# Patient Record
Sex: Female | Born: 2000 | Hispanic: No | Marital: Single | Smoking: Never smoker
Health system: Southern US, Community
[De-identification: ages and names within clinical notes are randomized; demographics above are authoritative.]

## PROBLEM LIST (undated history)

## (undated) DIAGNOSIS — N289 Disorder of kidney and ureter, unspecified: Secondary | ICD-10-CM

---
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
## 2018-01-14 DIAGNOSIS — F339 Major depressive disorder, recurrent, unspecified: Secondary | ICD-10-CM | POA: Diagnosis not present

## 2018-02-02 DIAGNOSIS — F339 Major depressive disorder, recurrent, unspecified: Secondary | ICD-10-CM | POA: Diagnosis not present

## 2018-03-10 DIAGNOSIS — F900 Attention-deficit hyperactivity disorder, predominantly inattentive type: Secondary | ICD-10-CM | POA: Diagnosis not present

## 2018-03-10 DIAGNOSIS — F332 Major depressive disorder, recurrent severe without psychotic features: Secondary | ICD-10-CM | POA: Diagnosis not present

## 2018-03-11 DIAGNOSIS — F332 Major depressive disorder, recurrent severe without psychotic features: Secondary | ICD-10-CM | POA: Diagnosis not present

## 2018-03-11 DIAGNOSIS — F900 Attention-deficit hyperactivity disorder, predominantly inattentive type: Secondary | ICD-10-CM | POA: Diagnosis not present

## 2018-03-12 DIAGNOSIS — F339 Major depressive disorder, recurrent, unspecified: Secondary | ICD-10-CM | POA: Diagnosis not present

## 2018-03-13 DIAGNOSIS — F900 Attention-deficit hyperactivity disorder, predominantly inattentive type: Secondary | ICD-10-CM | POA: Diagnosis not present

## 2018-03-13 DIAGNOSIS — F332 Major depressive disorder, recurrent severe without psychotic features: Secondary | ICD-10-CM | POA: Diagnosis not present

## 2018-04-02 DIAGNOSIS — F900 Attention-deficit hyperactivity disorder, predominantly inattentive type: Secondary | ICD-10-CM | POA: Diagnosis not present

## 2018-04-02 DIAGNOSIS — F332 Major depressive disorder, recurrent severe without psychotic features: Secondary | ICD-10-CM | POA: Diagnosis not present

## 2018-04-08 DIAGNOSIS — F900 Attention-deficit hyperactivity disorder, predominantly inattentive type: Secondary | ICD-10-CM | POA: Diagnosis not present

## 2018-04-08 DIAGNOSIS — F332 Major depressive disorder, recurrent severe without psychotic features: Secondary | ICD-10-CM | POA: Diagnosis not present

## 2018-04-09 DIAGNOSIS — F332 Major depressive disorder, recurrent severe without psychotic features: Secondary | ICD-10-CM | POA: Diagnosis not present

## 2018-04-09 DIAGNOSIS — F900 Attention-deficit hyperactivity disorder, predominantly inattentive type: Secondary | ICD-10-CM | POA: Diagnosis not present

## 2018-04-21 DIAGNOSIS — F3340 Major depressive disorder, recurrent, in remission, unspecified: Secondary | ICD-10-CM | POA: Diagnosis not present

## 2018-06-16 DIAGNOSIS — S0101XA Laceration without foreign body of scalp, initial encounter: Secondary | ICD-10-CM | POA: Diagnosis not present

## 2018-07-29 DIAGNOSIS — F900 Attention-deficit hyperactivity disorder, predominantly inattentive type: Secondary | ICD-10-CM | POA: Diagnosis not present

## 2018-07-29 DIAGNOSIS — F3340 Major depressive disorder, recurrent, in remission, unspecified: Secondary | ICD-10-CM | POA: Diagnosis not present

## 2018-08-04 DIAGNOSIS — F4310 Post-traumatic stress disorder, unspecified: Secondary | ICD-10-CM | POA: Diagnosis not present

## 2018-09-15 DIAGNOSIS — Z1159 Encounter for screening for other viral diseases: Secondary | ICD-10-CM | POA: Diagnosis not present

## 2019-01-26 DIAGNOSIS — Z1159 Encounter for screening for other viral diseases: Secondary | ICD-10-CM | POA: Diagnosis not present

## 2019-02-09 DIAGNOSIS — F900 Attention-deficit hyperactivity disorder, predominantly inattentive type: Secondary | ICD-10-CM | POA: Diagnosis not present

## 2019-02-09 DIAGNOSIS — F3340 Major depressive disorder, recurrent, in remission, unspecified: Secondary | ICD-10-CM | POA: Diagnosis not present

## 2019-02-16 DIAGNOSIS — F4310 Post-traumatic stress disorder, unspecified: Secondary | ICD-10-CM | POA: Diagnosis not present

## 2019-02-19 DIAGNOSIS — Z20828 Contact with and (suspected) exposure to other viral communicable diseases: Secondary | ICD-10-CM | POA: Diagnosis not present

## 2019-02-22 DIAGNOSIS — F4310 Post-traumatic stress disorder, unspecified: Secondary | ICD-10-CM | POA: Diagnosis not present

## 2019-03-01 DIAGNOSIS — F4310 Post-traumatic stress disorder, unspecified: Secondary | ICD-10-CM | POA: Diagnosis not present

## 2019-03-09 DIAGNOSIS — F3340 Major depressive disorder, recurrent, in remission, unspecified: Secondary | ICD-10-CM | POA: Diagnosis not present

## 2019-03-09 DIAGNOSIS — F900 Attention-deficit hyperactivity disorder, predominantly inattentive type: Secondary | ICD-10-CM | POA: Diagnosis not present

## 2019-03-10 DIAGNOSIS — F4310 Post-traumatic stress disorder, unspecified: Secondary | ICD-10-CM | POA: Diagnosis not present

## 2019-03-16 DIAGNOSIS — F4310 Post-traumatic stress disorder, unspecified: Secondary | ICD-10-CM | POA: Diagnosis not present

## 2019-03-23 DIAGNOSIS — F4310 Post-traumatic stress disorder, unspecified: Secondary | ICD-10-CM | POA: Diagnosis not present

## 2019-03-29 DIAGNOSIS — F900 Attention-deficit hyperactivity disorder, predominantly inattentive type: Secondary | ICD-10-CM | POA: Diagnosis not present

## 2019-03-29 DIAGNOSIS — F3340 Major depressive disorder, recurrent, in remission, unspecified: Secondary | ICD-10-CM | POA: Diagnosis not present

## 2019-04-06 DIAGNOSIS — F4310 Post-traumatic stress disorder, unspecified: Secondary | ICD-10-CM | POA: Diagnosis not present

## 2019-04-14 DIAGNOSIS — F4310 Post-traumatic stress disorder, unspecified: Secondary | ICD-10-CM | POA: Diagnosis not present

## 2019-04-20 DIAGNOSIS — F4310 Post-traumatic stress disorder, unspecified: Secondary | ICD-10-CM | POA: Diagnosis not present

## 2019-04-26 DIAGNOSIS — F3340 Major depressive disorder, recurrent, in remission, unspecified: Secondary | ICD-10-CM | POA: Diagnosis not present

## 2019-04-26 DIAGNOSIS — F900 Attention-deficit hyperactivity disorder, predominantly inattentive type: Secondary | ICD-10-CM | POA: Diagnosis not present

## 2019-04-27 DIAGNOSIS — F4310 Post-traumatic stress disorder, unspecified: Secondary | ICD-10-CM | POA: Diagnosis not present

## 2019-05-04 DIAGNOSIS — F4310 Post-traumatic stress disorder, unspecified: Secondary | ICD-10-CM | POA: Diagnosis not present

## 2019-05-05 DIAGNOSIS — Z0000 Encounter for general adult medical examination without abnormal findings: Secondary | ICD-10-CM | POA: Diagnosis not present

## 2019-05-11 DIAGNOSIS — F4310 Post-traumatic stress disorder, unspecified: Secondary | ICD-10-CM | POA: Diagnosis not present

## 2019-05-18 DIAGNOSIS — F4310 Post-traumatic stress disorder, unspecified: Secondary | ICD-10-CM | POA: Diagnosis not present

## 2019-05-24 DIAGNOSIS — F339 Major depressive disorder, recurrent, unspecified: Secondary | ICD-10-CM | POA: Diagnosis not present

## 2019-05-25 DIAGNOSIS — F4310 Post-traumatic stress disorder, unspecified: Secondary | ICD-10-CM | POA: Diagnosis not present

## 2019-06-01 DIAGNOSIS — F4310 Post-traumatic stress disorder, unspecified: Secondary | ICD-10-CM | POA: Diagnosis not present

## 2019-06-08 DIAGNOSIS — F4310 Post-traumatic stress disorder, unspecified: Secondary | ICD-10-CM | POA: Diagnosis not present

## 2019-06-15 DIAGNOSIS — F4310 Post-traumatic stress disorder, unspecified: Secondary | ICD-10-CM | POA: Diagnosis not present

## 2019-06-22 DIAGNOSIS — F4310 Post-traumatic stress disorder, unspecified: Secondary | ICD-10-CM | POA: Diagnosis not present

## 2019-07-06 DIAGNOSIS — F4310 Post-traumatic stress disorder, unspecified: Secondary | ICD-10-CM | POA: Diagnosis not present

## 2019-07-13 DIAGNOSIS — F4310 Post-traumatic stress disorder, unspecified: Secondary | ICD-10-CM | POA: Diagnosis not present

## 2019-07-20 DIAGNOSIS — F4310 Post-traumatic stress disorder, unspecified: Secondary | ICD-10-CM | POA: Diagnosis not present

## 2019-07-25 DIAGNOSIS — J0110 Acute frontal sinusitis, unspecified: Secondary | ICD-10-CM | POA: Diagnosis not present

## 2019-07-27 DIAGNOSIS — F4310 Post-traumatic stress disorder, unspecified: Secondary | ICD-10-CM | POA: Diagnosis not present

## 2019-08-10 DIAGNOSIS — F339 Major depressive disorder, recurrent, unspecified: Secondary | ICD-10-CM | POA: Diagnosis not present

## 2019-08-20 DIAGNOSIS — F4310 Post-traumatic stress disorder, unspecified: Secondary | ICD-10-CM | POA: Diagnosis not present

## 2019-08-30 DIAGNOSIS — F4310 Post-traumatic stress disorder, unspecified: Secondary | ICD-10-CM | POA: Diagnosis not present

## 2019-09-15 DIAGNOSIS — F4310 Post-traumatic stress disorder, unspecified: Secondary | ICD-10-CM | POA: Diagnosis not present

## 2019-09-20 DIAGNOSIS — F339 Major depressive disorder, recurrent, unspecified: Secondary | ICD-10-CM | POA: Diagnosis not present

## 2019-10-27 DIAGNOSIS — F4310 Post-traumatic stress disorder, unspecified: Secondary | ICD-10-CM | POA: Diagnosis not present

## 2019-11-01 DIAGNOSIS — F4310 Post-traumatic stress disorder, unspecified: Secondary | ICD-10-CM | POA: Diagnosis not present

## 2019-11-24 DIAGNOSIS — F4310 Post-traumatic stress disorder, unspecified: Secondary | ICD-10-CM | POA: Diagnosis not present

## 2019-12-01 DIAGNOSIS — F4310 Post-traumatic stress disorder, unspecified: Secondary | ICD-10-CM | POA: Diagnosis not present

## 2019-12-08 DIAGNOSIS — F4310 Post-traumatic stress disorder, unspecified: Secondary | ICD-10-CM | POA: Diagnosis not present

## 2019-12-14 DIAGNOSIS — F4310 Post-traumatic stress disorder, unspecified: Secondary | ICD-10-CM | POA: Diagnosis not present

## 2019-12-22 DIAGNOSIS — F4310 Post-traumatic stress disorder, unspecified: Secondary | ICD-10-CM | POA: Diagnosis not present

## 2019-12-29 DIAGNOSIS — F4310 Post-traumatic stress disorder, unspecified: Secondary | ICD-10-CM | POA: Diagnosis not present

## 2020-01-05 DIAGNOSIS — F4310 Post-traumatic stress disorder, unspecified: Secondary | ICD-10-CM | POA: Diagnosis not present

## 2020-01-12 DIAGNOSIS — F4310 Post-traumatic stress disorder, unspecified: Secondary | ICD-10-CM | POA: Diagnosis not present

## 2020-01-26 DIAGNOSIS — F4310 Post-traumatic stress disorder, unspecified: Secondary | ICD-10-CM | POA: Diagnosis not present

## 2020-01-31 DIAGNOSIS — J00 Acute nasopharyngitis [common cold]: Secondary | ICD-10-CM | POA: Diagnosis not present

## 2020-01-31 DIAGNOSIS — J09X9 Influenza due to identified novel influenza A virus with other manifestations: Secondary | ICD-10-CM | POA: Diagnosis not present

## 2020-02-02 DIAGNOSIS — F4310 Post-traumatic stress disorder, unspecified: Secondary | ICD-10-CM | POA: Diagnosis not present

## 2020-02-09 DIAGNOSIS — F4310 Post-traumatic stress disorder, unspecified: Secondary | ICD-10-CM | POA: Diagnosis not present

## 2020-02-16 DIAGNOSIS — F4310 Post-traumatic stress disorder, unspecified: Secondary | ICD-10-CM | POA: Diagnosis not present

## 2020-03-07 DIAGNOSIS — F339 Major depressive disorder, recurrent, unspecified: Secondary | ICD-10-CM | POA: Diagnosis not present

## 2020-03-08 DIAGNOSIS — F4310 Post-traumatic stress disorder, unspecified: Secondary | ICD-10-CM | POA: Diagnosis not present

## 2020-03-15 DIAGNOSIS — F4310 Post-traumatic stress disorder, unspecified: Secondary | ICD-10-CM | POA: Diagnosis not present

## 2020-03-22 DIAGNOSIS — F4310 Post-traumatic stress disorder, unspecified: Secondary | ICD-10-CM | POA: Diagnosis not present

## 2020-03-29 DIAGNOSIS — F4310 Post-traumatic stress disorder, unspecified: Secondary | ICD-10-CM | POA: Diagnosis not present

## 2020-04-05 DIAGNOSIS — F4310 Post-traumatic stress disorder, unspecified: Secondary | ICD-10-CM | POA: Diagnosis not present

## 2020-04-12 DIAGNOSIS — F4310 Post-traumatic stress disorder, unspecified: Secondary | ICD-10-CM | POA: Diagnosis not present

## 2020-04-19 DIAGNOSIS — F4310 Post-traumatic stress disorder, unspecified: Secondary | ICD-10-CM | POA: Diagnosis not present

## 2020-04-26 DIAGNOSIS — F4310 Post-traumatic stress disorder, unspecified: Secondary | ICD-10-CM | POA: Diagnosis not present

## 2020-05-03 DIAGNOSIS — F4310 Post-traumatic stress disorder, unspecified: Secondary | ICD-10-CM | POA: Diagnosis not present

## 2020-05-08 DIAGNOSIS — R110 Nausea: Secondary | ICD-10-CM | POA: Diagnosis not present

## 2020-05-08 DIAGNOSIS — F411 Generalized anxiety disorder: Secondary | ICD-10-CM | POA: Diagnosis not present

## 2020-05-10 DIAGNOSIS — F4310 Post-traumatic stress disorder, unspecified: Secondary | ICD-10-CM | POA: Diagnosis not present

## 2020-05-11 DIAGNOSIS — F339 Major depressive disorder, recurrent, unspecified: Secondary | ICD-10-CM | POA: Diagnosis not present

## 2020-05-24 DIAGNOSIS — N3001 Acute cystitis with hematuria: Secondary | ICD-10-CM | POA: Diagnosis not present

## 2020-05-24 DIAGNOSIS — N760 Acute vaginitis: Secondary | ICD-10-CM | POA: Diagnosis not present

## 2020-05-31 DIAGNOSIS — F4310 Post-traumatic stress disorder, unspecified: Secondary | ICD-10-CM | POA: Diagnosis not present

## 2020-06-01 DIAGNOSIS — F339 Major depressive disorder, recurrent, unspecified: Secondary | ICD-10-CM | POA: Diagnosis not present

## 2020-06-07 DIAGNOSIS — F4310 Post-traumatic stress disorder, unspecified: Secondary | ICD-10-CM | POA: Diagnosis not present

## 2020-06-12 DIAGNOSIS — F339 Major depressive disorder, recurrent, unspecified: Secondary | ICD-10-CM | POA: Diagnosis not present

## 2020-06-14 DIAGNOSIS — F4310 Post-traumatic stress disorder, unspecified: Secondary | ICD-10-CM | POA: Diagnosis not present

## 2020-06-28 DIAGNOSIS — F4310 Post-traumatic stress disorder, unspecified: Secondary | ICD-10-CM | POA: Diagnosis not present

## 2020-07-05 DIAGNOSIS — F4310 Post-traumatic stress disorder, unspecified: Secondary | ICD-10-CM | POA: Diagnosis not present

## 2020-07-12 DIAGNOSIS — F4310 Post-traumatic stress disorder, unspecified: Secondary | ICD-10-CM | POA: Diagnosis not present

## 2020-07-17 DIAGNOSIS — F4310 Post-traumatic stress disorder, unspecified: Secondary | ICD-10-CM | POA: Diagnosis not present

## 2020-07-18 DIAGNOSIS — Z131 Encounter for screening for diabetes mellitus: Secondary | ICD-10-CM | POA: Diagnosis not present

## 2020-07-18 DIAGNOSIS — Z13220 Encounter for screening for lipoid disorders: Secondary | ICD-10-CM | POA: Diagnosis not present

## 2020-07-18 DIAGNOSIS — Z0000 Encounter for general adult medical examination without abnormal findings: Secondary | ICD-10-CM | POA: Diagnosis not present

## 2020-07-19 DIAGNOSIS — F4310 Post-traumatic stress disorder, unspecified: Secondary | ICD-10-CM | POA: Diagnosis not present

## 2020-07-26 DIAGNOSIS — F4310 Post-traumatic stress disorder, unspecified: Secondary | ICD-10-CM | POA: Diagnosis not present

## 2020-08-02 DIAGNOSIS — F4310 Post-traumatic stress disorder, unspecified: Secondary | ICD-10-CM | POA: Diagnosis not present

## 2020-08-03 DIAGNOSIS — Z01419 Encounter for gynecological examination (general) (routine) without abnormal findings: Secondary | ICD-10-CM | POA: Diagnosis not present

## 2020-08-03 DIAGNOSIS — Z113 Encounter for screening for infections with a predominantly sexual mode of transmission: Secondary | ICD-10-CM | POA: Diagnosis not present

## 2020-08-03 DIAGNOSIS — N898 Other specified noninflammatory disorders of vagina: Secondary | ICD-10-CM | POA: Diagnosis not present

## 2020-08-09 DIAGNOSIS — F4310 Post-traumatic stress disorder, unspecified: Secondary | ICD-10-CM | POA: Diagnosis not present

## 2020-08-22 DIAGNOSIS — F4310 Post-traumatic stress disorder, unspecified: Secondary | ICD-10-CM | POA: Diagnosis not present

## 2020-08-29 DIAGNOSIS — F4310 Post-traumatic stress disorder, unspecified: Secondary | ICD-10-CM | POA: Diagnosis not present

## 2020-09-08 DIAGNOSIS — N3000 Acute cystitis without hematuria: Secondary | ICD-10-CM | POA: Diagnosis not present

## 2020-09-12 DIAGNOSIS — F4310 Post-traumatic stress disorder, unspecified: Secondary | ICD-10-CM | POA: Diagnosis not present

## 2020-09-26 DIAGNOSIS — F4310 Post-traumatic stress disorder, unspecified: Secondary | ICD-10-CM | POA: Diagnosis not present

## 2020-10-10 DIAGNOSIS — F4310 Post-traumatic stress disorder, unspecified: Secondary | ICD-10-CM | POA: Diagnosis not present

## 2020-11-07 DIAGNOSIS — F4310 Post-traumatic stress disorder, unspecified: Secondary | ICD-10-CM | POA: Diagnosis not present

## 2021-03-12 DIAGNOSIS — Z111 Encounter for screening for respiratory tuberculosis: Secondary | ICD-10-CM | POA: Diagnosis not present

## 2021-03-27 DIAGNOSIS — L700 Acne vulgaris: Secondary | ICD-10-CM | POA: Diagnosis not present

## 2021-03-28 DIAGNOSIS — F411 Generalized anxiety disorder: Secondary | ICD-10-CM | POA: Diagnosis not present

## 2021-04-12 DIAGNOSIS — B081 Molluscum contagiosum: Secondary | ICD-10-CM | POA: Diagnosis not present

## 2021-04-12 DIAGNOSIS — N941 Unspecified dyspareunia: Secondary | ICD-10-CM | POA: Diagnosis not present

## 2021-05-10 DIAGNOSIS — F411 Generalized anxiety disorder: Secondary | ICD-10-CM | POA: Diagnosis not present

## 2021-06-07 DIAGNOSIS — F411 Generalized anxiety disorder: Secondary | ICD-10-CM | POA: Diagnosis not present

## 2021-08-21 DIAGNOSIS — L700 Acne vulgaris: Secondary | ICD-10-CM | POA: Diagnosis not present

## 2021-08-21 DIAGNOSIS — Z79899 Other long term (current) drug therapy: Secondary | ICD-10-CM | POA: Diagnosis not present

## 2021-10-03 DIAGNOSIS — U071 COVID-19: Secondary | ICD-10-CM | POA: Diagnosis not present

## 2021-10-22 DIAGNOSIS — R300 Dysuria: Secondary | ICD-10-CM | POA: Diagnosis not present

## 2021-12-19 DIAGNOSIS — R300 Dysuria: Secondary | ICD-10-CM | POA: Diagnosis not present

## 2022-06-05 DIAGNOSIS — L700 Acne vulgaris: Secondary | ICD-10-CM | POA: Diagnosis not present

## 2022-06-05 DIAGNOSIS — L810 Postinflammatory hyperpigmentation: Secondary | ICD-10-CM | POA: Diagnosis not present

## 2022-06-20 DIAGNOSIS — Z0000 Encounter for general adult medical examination without abnormal findings: Secondary | ICD-10-CM | POA: Diagnosis not present

## 2022-08-15 DIAGNOSIS — R399 Unspecified symptoms and signs involving the genitourinary system: Secondary | ICD-10-CM | POA: Diagnosis not present

## 2022-08-15 DIAGNOSIS — R109 Unspecified abdominal pain: Secondary | ICD-10-CM | POA: Diagnosis not present

## 2022-10-25 DIAGNOSIS — R140 Abdominal distension (gaseous): Secondary | ICD-10-CM | POA: Diagnosis not present

## 2023-02-03 DIAGNOSIS — R140 Abdominal distension (gaseous): Secondary | ICD-10-CM | POA: Diagnosis not present

## 2023-02-05 ENCOUNTER — Encounter: Payer: Self-pay | Admitting: Student

## 2023-02-14 DIAGNOSIS — R140 Abdominal distension (gaseous): Secondary | ICD-10-CM | POA: Diagnosis not present

## 2023-02-14 DIAGNOSIS — R635 Abnormal weight gain: Secondary | ICD-10-CM | POA: Diagnosis not present

## 2023-02-14 DIAGNOSIS — R109 Unspecified abdominal pain: Secondary | ICD-10-CM | POA: Diagnosis not present

## 2023-02-14 DIAGNOSIS — R142 Eructation: Secondary | ICD-10-CM | POA: Diagnosis not present

## 2023-02-14 DIAGNOSIS — R1110 Vomiting, unspecified: Secondary | ICD-10-CM | POA: Diagnosis not present

## 2023-06-23 DIAGNOSIS — Z131 Encounter for screening for diabetes mellitus: Secondary | ICD-10-CM | POA: Diagnosis not present

## 2023-06-23 DIAGNOSIS — Z0000 Encounter for general adult medical examination without abnormal findings: Secondary | ICD-10-CM | POA: Diagnosis not present

## 2023-06-23 DIAGNOSIS — R140 Abdominal distension (gaseous): Secondary | ICD-10-CM | POA: Diagnosis not present

## 2023-06-23 DIAGNOSIS — Z13220 Encounter for screening for lipoid disorders: Secondary | ICD-10-CM | POA: Diagnosis not present

## 2023-08-28 DIAGNOSIS — Z01419 Encounter for gynecological examination (general) (routine) without abnormal findings: Secondary | ICD-10-CM | POA: Diagnosis not present

## 2023-08-28 DIAGNOSIS — Z124 Encounter for screening for malignant neoplasm of cervix: Secondary | ICD-10-CM | POA: Diagnosis not present

## 2023-09-08 DIAGNOSIS — Z30430 Encounter for insertion of intrauterine contraceptive device: Secondary | ICD-10-CM | POA: Diagnosis not present

## 2023-09-08 DIAGNOSIS — Z3202 Encounter for pregnancy test, result negative: Secondary | ICD-10-CM | POA: Diagnosis not present

## 2023-11-06 DIAGNOSIS — R87610 Atypical squamous cells of undetermined significance on cytologic smear of cervix (ASC-US): Secondary | ICD-10-CM | POA: Diagnosis not present

## 2023-11-06 DIAGNOSIS — R87810 Cervical high risk human papillomavirus (HPV) DNA test positive: Secondary | ICD-10-CM | POA: Diagnosis not present
